# Patient Record
Sex: Female | Born: 2000 | Race: White | Hispanic: No | Smoking: Never smoker
Health system: Southern US, Community
[De-identification: ages and names within clinical notes are randomized; demographics above are authoritative.]

## PROBLEM LIST (undated history)

## (undated) DIAGNOSIS — F419 Anxiety disorder, unspecified: Secondary | ICD-10-CM

## (undated) DIAGNOSIS — H539 Unspecified visual disturbance: Secondary | ICD-10-CM

## (undated) DIAGNOSIS — B009 Herpesviral infection, unspecified: Secondary | ICD-10-CM

## (undated) DIAGNOSIS — J353 Hypertrophy of tonsils with hypertrophy of adenoids: Secondary | ICD-10-CM

## (undated) DIAGNOSIS — J342 Deviated nasal septum: Secondary | ICD-10-CM

## (undated) DIAGNOSIS — F32A Depression, unspecified: Secondary | ICD-10-CM

## (undated) DIAGNOSIS — J302 Other seasonal allergic rhinitis: Secondary | ICD-10-CM

## (undated) HISTORY — DX: Herpesviral infection, unspecified: B00.9

## (undated) HISTORY — DX: Anxiety disorder, unspecified: F41.9

## (undated) HISTORY — DX: Depression, unspecified: F32.A

## (undated) HISTORY — PX: ADENOIDECTOMY: SUR15

## (undated) HISTORY — PX: MYRINGOTOMY: SUR874

## (undated) HISTORY — DX: Deviated nasal septum: J34.2

---
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
## 2021-08-24 ENCOUNTER — Emergency Department (HOSPITAL_COMMUNITY)
Admission: EM | Admit: 2021-08-24 | Discharge: 2021-08-24 | Disposition: A | Discharge status: Home / Self Care | Payer: Medicaid Other | Attending: Emergency Medicine | Admitting: Emergency Medicine

## 2021-08-24 ENCOUNTER — Other Ambulatory Visit: Payer: Self-pay

## 2021-08-24 ENCOUNTER — Encounter (HOSPITAL_COMMUNITY): Payer: Self-pay | Admitting: Emergency Medicine

## 2021-08-24 DIAGNOSIS — J029 Acute pharyngitis, unspecified: Secondary | ICD-10-CM | POA: Diagnosis not present

## 2021-08-24 DIAGNOSIS — Z7722 Contact with and (suspected) exposure to environmental tobacco smoke (acute) (chronic): Secondary | ICD-10-CM | POA: Diagnosis not present

## 2021-08-24 LAB — GROUP A STREP BY PCR: Group A Strep by PCR: NOT DETECTED

## 2021-08-24 MED ORDER — LIDOCAINE VISCOUS HCL 2 % MT SOLN
15.0000 mL | Freq: Once | OROMUCOSAL | Status: AC
  Administered 2021-08-24: 15 mL via OROMUCOSAL
  Filled 2021-08-24: qty 15

## 2021-08-24 MED ORDER — IBUPROFEN 400 MG PO TABS
600.0000 mg | ORAL_TABLET | Freq: Once | ORAL | Status: AC
  Administered 2021-08-24: 600 mg via ORAL
  Filled 2021-08-24: qty 2

## 2021-08-24 NOTE — ED Provider Notes (Signed)
[facility] EMERGENCY DEPARTMENT Provider Note  CSN: [id number] Arrival date & time: 08/24/21 1206    History Chief Complaint  Patient presents with   Sore Throat    Melanie Rubio is a 20 y.o. female with history of strep, s/p tonsillectomy reports 2 days of sore throat worse with swallowing, not associated with fever or cough. No loss of taste or smell.    Past Medical History:  Diagnosis Date   Seasonal allergies    Tonsillar and adenoid hypertrophy    Vision abnormalities    wears glasses    Past Surgical History:  Procedure Laterality Date   ADENOIDECTOMY     CLOSED REDUCTION FOREARM FRACTURE Left 09/09/2010   MYRINGOTOMY     NASAL SEPTOPLASTY W/ TURBINOPLASTY Bilateral 12/04/2019   Procedure: NASAL SEPTOPLASTY WITH  BILATERAL TURBINATE REDUCTION;  Surgeon: Newman Pies, MD;  Location: [facility];  Service: ENT;  Laterality: Bilateral;   TONSILLECTOMY AND ADENOIDECTOMY N/A 06/13/2013   Procedure: TONSILLECTOMY AND ADENOIDECTOMY;  Surgeon: Darletta Moll, MD;  Location: [facility];  Service: ENT;  Laterality: N/A;    No family history on file.  Social History   Tobacco Use   Smoking status: Passive Smoke Exposure - Never Smoker   Smokeless tobacco: Never  Vaping Use   Vaping Use: Never used  Substance Use Topics   Alcohol use: No   Drug use: No     Home Medications Prior to Admission medications   Medication Sig Start Date End Date Taking? Authorizing Provider  cetirizine (ZYRTEC) 5 MG chewable tablet Chew 5 mg by mouth daily.    [provider]     Allergies    Patient has no known allergies.   Review of Systems   Review of Systems A comprehensive review of systems was completed and negative except as noted in HPI.    Physical Exam BP 129/77 (BP Location: Right Arm)   Pulse 77   Temp 98.6 F (37 C) (Oral)   Resp 15   SpO2 100%   Physical Exam Vitals and nursing note reviewed.  Constitutional:       Appearance: Normal appearance.  HENT:     Head: Normocephalic and atraumatic.     Nose: Nose normal.     Mouth/Throat:     Mouth: Mucous membranes are moist.     Pharynx: Uvula midline. Posterior oropharyngeal erythema present. No oropharyngeal exudate or uvula swelling.     Comments: Tonsils surgically absent Eyes:     Extraocular Movements: Extraocular movements intact.     Conjunctiva/sclera: Conjunctivae normal.  Cardiovascular:     Rate and Rhythm: Normal rate.  Pulmonary:     Effort: Pulmonary effort is normal.     Breath sounds: Normal breath sounds.  Abdominal:     General: Abdomen is flat.     Palpations: Abdomen is soft.     Tenderness: There is no abdominal tenderness.  Musculoskeletal:        General: No swelling. Normal range of motion.     Cervical back: Neck supple.  Lymphadenopathy:     Cervical: No cervical adenopathy.  Skin:    General: Skin is warm and dry.  Neurological:     General: No focal deficit present.     Mental Status: She is alert.  Psychiatric:        Mood and Affect: Mood normal.     ED Results / Procedures / Treatments   Labs (all labs ordered are  listed, but only abnormal results are displayed) Labs Reviewed  GROUP A STREP BY PCR    EKG None  Radiology No results found.  Procedures Procedures  Medications Ordered in the ED Medications  lidocaine (XYLOCAINE) 2 % viscous mouth solution 15 mL (has no administration in time range)  ibuprofen (ADVIL) tablet 600 mg (has no administration in time range)     MDM Rules/Calculators/A&P MDM Strep is negative. Likely viral, patient declined Covid testing. Recommend supportive care at home and PCP follow up. RTED for any other concerns.   ED Course  I have reviewed the triage vital signs and the nursing notes.  Pertinent labs & imaging results that were available during my care of the patient were reviewed by me and considered in my medical decision making (see chart for  details).     Final Clinical Impression(s) / ED Diagnoses Final diagnoses:  Viral pharyngitis    Rx / DC Orders ED Discharge Orders     None        Pollyann Savoy, MD 08/24/21 1351

## 2021-08-24 NOTE — ED Triage Notes (Signed)
Pt reports sore throat that started Friday afternoon. Denies fevers or cough.

## 2021-08-24 NOTE — ED Notes (Signed)
Pt ambulatory to waiting room. Pt verbalized understanding of discharge instructions.   

## 2022-01-30 ENCOUNTER — Emergency Department (HOSPITAL_COMMUNITY)
Admission: EM | Admit: 2022-01-30 | Discharge: 2022-01-30 | Disposition: A | Discharge status: Home / Self Care | Payer: Medicaid Other | Attending: Emergency Medicine | Admitting: Emergency Medicine

## 2022-01-30 ENCOUNTER — Encounter (HOSPITAL_COMMUNITY): Payer: Self-pay | Admitting: *Deleted

## 2022-01-30 DIAGNOSIS — N390 Urinary tract infection, site not specified: Secondary | ICD-10-CM | POA: Insufficient documentation

## 2022-01-30 DIAGNOSIS — R350 Frequency of micturition: Secondary | ICD-10-CM | POA: Diagnosis present

## 2022-01-30 LAB — URINALYSIS, ROUTINE W REFLEX MICROSCOPIC
Bilirubin Urine: NEGATIVE
Glucose, UA: NEGATIVE mg/dL
Hgb urine dipstick: NEGATIVE
Ketones, ur: 20 mg/dL — AB
Nitrite: NEGATIVE
Protein, ur: NEGATIVE mg/dL
Specific Gravity, Urine: 1.025 (ref 1.005–1.030)
pH: 6 (ref 5.0–8.0)

## 2022-01-30 LAB — POC URINE PREG, ED: Preg Test, Ur: NEGATIVE

## 2022-01-30 LAB — PREGNANCY, URINE: Preg Test, Ur: NEGATIVE

## 2022-01-30 MED ORDER — CEPHALEXIN 500 MG PO CAPS: 500.0000 mg | ORAL_CAPSULE | Freq: Four times a day (QID) | ORAL | 0 refills | Status: AC

## 2022-01-30 NOTE — Discharge Instructions (Addendum)
Follow up with your Physician for recheck  

## 2022-01-30 NOTE — ED Provider Notes (Signed)
? EMERGENCY DEPARTMENT ?Provider Note ? ? ?CSN: [id number] ?Arrival date & time: 01/30/22  1841 ? ?  ? ?History ? ?Chief Complaint  ?Patient presents with  ? Urinary Frequency  ? ? ?Melanie Rubio is a 21 y.o. female. ? ?The history is provided by the patient. No language interpreter was used.  ?Urinary Frequency ?This is a new problem. The current episode started yesterday. The problem occurs constantly. The problem has not changed since onset.Pertinent negatives include no abdominal pain. Nothing aggravates the symptoms. Nothing relieves the symptoms. She has tried nothing for the symptoms. The treatment provided no relief.  ? ?  ? ?Home Medications ?Prior to Admission medications   ?Medication Sig Start Date End Date Taking? Authorizing Provider  ?cephALEXin (KEFLEX) 500 MG capsule Take 1 capsule (500 mg total) by mouth 4 (four) times daily for 10 days. 01/30/22 02/09/22 Yes Elson Areas, PA-C  ?cetirizine (ZYRTEC) 5 MG chewable tablet Chew 5 mg by mouth daily.    [provider]  ?   ? ?Allergies    ?Patient has no known allergies.   ? ?Review of Systems   ?Review of Systems  ?Gastrointestinal:  Negative for abdominal pain.  ?Genitourinary:  Positive for frequency.  ?All other systems reviewed and are negative. ? ?Physical Exam ?Updated Vital Signs ?BP 126/71 (BP Location: Right Arm)   Pulse 69   Temp 98.7 ?F (37.1 ?C) (Oral)   Resp 18   Ht 5\' 5"  (1.651 m)   Wt 63.5 kg   SpO2 100%   BMI 23.30 kg/m?  ?Physical Exam ?Vitals and nursing note reviewed.  ?Constitutional:   ?   Appearance: She is well-developed.  ?HENT:  ?   Head: Normocephalic.  ?Cardiovascular:  ?   Rate and Rhythm: Normal rate.  ?Pulmonary:  ?   Effort: Pulmonary effort is normal.  ?Abdominal:  ?   General: There is no distension.  ?Musculoskeletal:     ?   General: Normal range of motion.  ?   Cervical back: Normal range of motion.  ?Skin: ?   General: Skin is warm.  ?Neurological:  ?   Mental Status: She is alert and  oriented to person, place, and time.  ? ? ?ED Results / Procedures / Treatments   ?Labs ?(all labs ordered are listed, but only abnormal results are displayed) ?Labs Reviewed  ?URINALYSIS, ROUTINE W REFLEX MICROSCOPIC - Abnormal; Notable for the following components:  ?    Result Value  ? APPearance CLOUDY (*)   ? Ketones, ur 20 (*)   ? Leukocytes,Ua SMALL (*)   ? Bacteria, UA MANY (*)   ? All other components within normal limits  ?PREGNANCY, URINE  ?POC URINE PREG, ED  ? ? ?EKG ?None ? ?Radiology ?No results found. ? ?Procedures ?Procedures  ? ? ?Medications Ordered in ED ?Medications - No data to display ? ?ED Course/ Medical Decision Making/ A&P ?  ?                        ?Medical Decision Making ?Amount and/or Complexity of Data Reviewed ?Labs: ordered. ? ?Risk ?Prescription drug management. ? ? ?MDM:  Ua shows many bacteria.  Pt given an rx for keflex.  Pt advised to follow up with primary care for recheck.  ? ? ? ? ? ? ? ?Final Clinical Impression(s) / ED Diagnoses ?Final diagnoses:  ?Urinary tract infection without hematuria, site unspecified  ? ? ?Rx / DC Orders ?  ED Discharge Orders   ? ?      Ordered  ?  cephALEXin (KEFLEX) 500 MG capsule  4 times daily       ? 01/30/22 2002  ? ?  ?  ? ?  ? ?An After Visit Summary was printed and given to the patient. ? ?  ?Elson Areas, [place] ?01/30/22 2023 ? ?  ?Linwood Dibbles, MD ?01/31/22 1618 ? ?

## 2022-01-30 NOTE — ED Triage Notes (Signed)
Urinary frequency for over a week ?

## 2022-03-08 ENCOUNTER — Encounter (HOSPITAL_COMMUNITY): Payer: Self-pay

## 2022-03-08 ENCOUNTER — Other Ambulatory Visit: Payer: Self-pay

## 2022-03-08 ENCOUNTER — Emergency Department (HOSPITAL_COMMUNITY)
Admission: EM | Admit: 2022-03-08 | Discharge: 2022-03-08 | Disposition: A | Discharge status: Home / Self Care | Payer: Medicaid Other | Attending: Emergency Medicine | Admitting: Emergency Medicine

## 2022-03-08 DIAGNOSIS — Z87440 Personal history of urinary (tract) infections: Secondary | ICD-10-CM | POA: Insufficient documentation

## 2022-03-08 DIAGNOSIS — R112 Nausea with vomiting, unspecified: Secondary | ICD-10-CM | POA: Diagnosis present

## 2022-03-08 DIAGNOSIS — R1084 Generalized abdominal pain: Secondary | ICD-10-CM | POA: Insufficient documentation

## 2022-03-08 LAB — URINALYSIS, ROUTINE W REFLEX MICROSCOPIC
Bilirubin Urine: NEGATIVE
Glucose, UA: NEGATIVE mg/dL
Hgb urine dipstick: NEGATIVE
Ketones, ur: 20 mg/dL — AB
Leukocytes,Ua: NEGATIVE
Nitrite: NEGATIVE
Protein, ur: NEGATIVE mg/dL
Specific Gravity, Urine: 1.015 (ref 1.005–1.030)
pH: 7 (ref 5.0–8.0)

## 2022-03-08 LAB — CBC WITH DIFFERENTIAL/PLATELET
Abs Immature Granulocytes: 0.01 10*3/uL (ref 0.00–0.07)
Basophils Absolute: 0 10*3/uL (ref 0.0–0.1)
Basophils Relative: 1 %
Eosinophils Absolute: 0.1 10*3/uL (ref 0.0–0.5)
Eosinophils Relative: 1 %
HCT: 44 % (ref 36.0–46.0)
Hemoglobin: 14.9 g/dL (ref 12.0–15.0)
Immature Granulocytes: 0 %
Lymphocytes Relative: 33 %
Lymphs Abs: 2.4 10*3/uL (ref 0.7–4.0)
MCH: 30.5 pg (ref 26.0–34.0)
MCHC: 33.9 g/dL (ref 30.0–36.0)
MCV: 90.2 fL (ref 80.0–100.0)
Monocytes Absolute: 0.5 10*3/uL (ref 0.1–1.0)
Monocytes Relative: 7 %
Neutro Abs: 4.2 10*3/uL (ref 1.7–7.7)
Neutrophils Relative %: 58 %
Platelets: 234 10*3/uL (ref 150–400)
RBC: 4.88 MIL/uL (ref 3.87–5.11)
RDW: 11.9 % (ref 11.5–15.5)
WBC: 7.2 10*3/uL (ref 4.0–10.5)
nRBC: 0 % (ref 0.0–0.2)

## 2022-03-08 LAB — POC URINE PREG, ED: Preg Test, Ur: NEGATIVE

## 2022-03-08 LAB — COMPREHENSIVE METABOLIC PANEL
ALT: 12 U/L (ref 0–44)
AST: 15 U/L (ref 15–41)
Albumin: 4.7 g/dL (ref 3.5–5.0)
Alkaline Phosphatase: 45 U/L (ref 38–126)
Anion gap: 9 (ref 5–15)
BUN: 9 mg/dL (ref 6–20)
CO2: 24 mmol/L (ref 22–32)
Calcium: 9.6 mg/dL (ref 8.9–10.3)
Chloride: 105 mmol/L (ref 98–111)
Creatinine, Ser: 0.67 mg/dL (ref 0.44–1.00)
GFR, Estimated: >60 mL/min (ref >60)
Glucose, Bld: 93 mg/dL (ref 70–99)
Potassium: 3.6 mmol/L (ref 3.5–5.1)
Sodium: 138 mmol/L (ref 135–145)
Total Bilirubin: 1.8 mg/dL — ABNORMAL HIGH (ref 0.3–1.2)
Total Protein: 8.1 g/dL (ref 6.5–8.1)

## 2022-03-08 LAB — LIPASE, BLOOD: Lipase: 29 U/L (ref 11–51)

## 2022-03-08 MED ORDER — SODIUM CHLORIDE 0.9 % IV BOLUS
1000.0000 mL | Freq: Once | INTRAVENOUS | Status: AC
  Administered 2022-03-08: 1000 mL via INTRAVENOUS

## 2022-03-08 NOTE — ED Triage Notes (Addendum)
Reports generalized fatigue for awhile but loss of appetite, nausea vomiting diarrhea 2 days ago.  Patient went to PCP on Tuesday and had negative preg test.  Hasnt vomited since Thursday, last meal was last night.  Just had nausea and dry heaving this am.  ?

## 2022-03-08 NOTE — Discharge Instructions (Signed)
Please follow-up with your primary care doctor for further evaluation.  Return to the emergency department for any worsening symptoms. ?

## 2022-03-08 NOTE — ED Provider Notes (Signed)
?[facility] ?Provider Note ? ? ?CSN: [id number] ?Arrival date & time: 03/08/22  1202 ? ?  ? ?History ?Chief Complaint  ?Patient presents with  ? Emesis  ? ? ?Melanie Rubio is a 21 y.o. female with no significant past medical history who presents to the emergency department today with multiple complaints.  She states on Tuesday she had 1 episode of vomiting and has had some intermittent diarrhea since then.  Over the last couple of weeks she has been having decreased appetite and has noticed a 5 to 6 pound weight loss over the last couple of months.  Patient also endorses intermittent abdominal pain that is poorly localized over the last week or so.  Significant other at bedside does state that patient has been battling some depression.  She does not take any medications for depression.  No history of anxiety.  She denies any fever or chills. She did have one episode of vomiting prior to arrival.  ? ?Of note, patient was seen and evaluated at her PCP on Tuesday and was given ciprofloxacin and metronidazole for UTI.  Patient did not complete those dosages as it made her nausea worse. ? ? ?Emesis ? ?  ? ?Home Medications ?Prior to Admission medications   ?Medication Sig Start Date End Date Taking? Authorizing Provider  ?metroNIDAZOLE (METROGEL) 0.75 % vaginal gel Place 1 Applicatorful vaginally at bedtime. 03/06/22  Yes [provider]  ?ondansetron (ZOFRAN-ODT) 4 MG disintegrating tablet Take 4 mg by mouth every 8 (eight) hours as needed. 03/06/22  Yes [provider]  ?cetirizine (ZYRTEC) 5 MG chewable tablet Chew 5 mg by mouth daily. ?Patient not taking: Reported on 03/08/2022    [provider]  ?ciprofloxacin (CIPRO) 250 MG tablet Take 250 mg by mouth 2 (two) times daily. ?Patient not taking: Reported on 03/08/2022 03/03/22   [provider]  ?metroNIDAZOLE (FLAGYL) 500 MG tablet Take 500 mg by mouth 2 (two) times daily. ?Patient not taking: Reported on  03/08/2022 03/03/22   [provider]  ?   ? ?Allergies    ?Patient has no known allergies.   ? ?Review of Systems   ?Review of Systems  ?Gastrointestinal:  Positive for vomiting.  ?All other systems reviewed and are negative. ? ?Physical Exam ?Updated Vital Signs ?BP 114/84   Pulse 65   Temp 98.2 ?F (36.8 ?C) (Oral)   Resp 18   Ht 5\' 5"  (1.651 m)   Wt 60.3 kg   LMP 03/07/2022   SpO2 100%   BMI 22.13 kg/m?  ?Physical Exam ?Vitals and nursing note reviewed.  ?Constitutional:   ?   General: She is not in acute distress. ?   Appearance: Normal appearance.  ?HENT:  ?   Head: Normocephalic and atraumatic.  ?Eyes:  ?   General:     ?   Right eye: No discharge.     ?   Left eye: No discharge.  ?Cardiovascular:  ?   Comments: Regular rate and rhythm.  S1/S2 are distinct without any evidence of murmur, rubs, or gallops.  Radial pulses are 2+ bilaterally.  Dorsalis pedis pulses are 2+ bilaterally.  No evidence of pedal edema. ?Pulmonary:  ?   Comments: Clear to auscultation bilaterally.  Normal effort.  No respiratory distress.  No evidence of wheezes, rales, or rhonchi heard throughout. ?Abdominal:  ?   General: Abdomen is flat. Bowel sounds are normal. There is no distension.  ?   Tenderness: There is no abdominal tenderness.  There is no guarding or rebound.  ?Musculoskeletal:     ?   General: Normal range of motion.  ?   Cervical back: Neck supple.  ?Skin: ?   General: Skin is warm and dry.  ?   Findings: No rash.  ?Neurological:  ?   General: No focal deficit present.  ?   Mental Status: She is alert.  ?Psychiatric:     ?   Mood and Affect: Mood normal.     ?   Behavior: Behavior normal.  ? ? ?ED Results / Procedures / Treatments   ?Labs ?(all labs ordered are listed, but only abnormal results are displayed) ?Labs Reviewed  ?COMPREHENSIVE METABOLIC PANEL - Abnormal; Notable for the following components:  ?    Result Value  ? Total Bilirubin 1.8 (*)   ? All other components within normal limits  ?URINALYSIS,  ROUTINE W REFLEX MICROSCOPIC - Abnormal; Notable for the following components:  ? APPearance CLOUDY (*)   ? Ketones, ur 20 (*)   ? All other components within normal limits  ?POC URINE PREG, ED - Normal  ?CBC WITH DIFFERENTIAL/PLATELET  ?LIPASE, BLOOD  ? ? ?EKG ?None ? ?Radiology ?No results found. ? ?Procedures ?Procedures  ? ? ?Medications Ordered in ED ?Medications  ?sodium chloride 0.9 % bolus 1,000 mL (0 mLs Intravenous Stopped 03/08/22 1544)  ? ? ?ED Course/ Medical Decision Making/ A&P ?  ?                        ?Medical Decision Making ?Amount and/or Complexity of Data Reviewed ?Labs: ordered. ? ? ?This patient presents to the ED for concern of vomiting and poorly localized abdominal pain, this involves an extensive number of treatment options, and is a complaint that carries with it a high risk of complications and morbidity.  The differential diagnosis includes viral gastroenteritis, somatic manifestation of depression, electrolyte abnormalities. ? ? ?Co morbidities that complicate the patient evaluation ? ?Past Medical History:  ?Diagnosis Date  ? Seasonal allergies   ? Tonsillar and adenoid hypertrophy   ? Vision abnormalities   ? wears glasses  ? ? ?Additional history obtained: ? ?Additional history obtained from nursing note ?Old records reviewed and no relevant documentation to her visit today. ? ?Lab Tests: ? ?I Ordered, and personally interpreted labs.  The pertinent results include: CBC shows no evidence of leukocytosis or anemia.  Lipase is normal.  Doubt pancreatitis.  Pregnancy negative.  CMP was normal.  Urinalysis normal. ? ? ?Imaging Studies ordered: ? ?None ? ? ?Cardiac Monitoring: ? ?The patient was maintained on a cardiac monitor.  I personally viewed and interpreted the cardiac monitored which showed an underlying rhythm of: Normal sinus rhythm ? ? ?Medicines ordered and prescription drug management: ? ?I ordered medication including fluids for dehydration ?Reevaluation of the patient  after these medicines showed that the patient improved ?I have reviewed the patients home medicines and have made adjustments as needed ? ? ?Test Considered: ? ?N/A ? ? ?Critical Interventions: ? ?Fluids ? ? ?Problem List / ED Course: ? ?Patient presents to the emergency department with multiple complaints.  Patient is complaining of vomiting, some episodes of diarrhea, and intermittent poorly localized abdominal pain.  Labs are all reassuring for any emergent pathology at this time.  Abdomen is soft and not tender.  I doubt acute abdomen at this time.  Patient feeling much better after fluids and tolerated p.o. intake in the ED.  Patient  comfortable going home.  She will call and schedule an appointment with her PCP next week.  She is safe for discharge.  Strict turn precautions were discussed. ? ? ?Reevaluation: ? ?After the interventions noted above, I reevaluated the patient and found that they have :improved ? ? ?Social Determinants of Health: ? ?Social Determinants of Health with Concerns  ? ?Tobacco Use: Medium Risk  ? Smoking Tobacco Use: Passive Smoke Exposure - Never Smoker  ? Smokeless Tobacco Use: Never  ? Passive Exposure: Yes  ?Financial Resource Strain: Not on file  ?Food Insecurity: Not on file  ?Transportation Needs: Not on file  ?Physical Activity: Not on file  ?Stress: Not on file  ?Social Connections: Not on file  ?Intimate Partner Violence: Not on file  ?Depression (PHQ2-9): Not on file  ?Alcohol Screen: Not on file  ?Housing: Not on file  ? ? ?Dispostion: ? ?After consideration of the diagnostic results and the patients response to treatment, I feel that the patent would benefit from outpatient follow-up with her PCP.  Vitals are completely normal.  She is safe for discharge. ? ?Final Clinical Impression(s) / ED Diagnoses ?Final diagnoses:  ?Nausea and vomiting, unspecified vomiting type  ? ? ?Rx / DC Orders ?ED Discharge Orders   ? ? None  ? ?  ? ? ?  ?Honor Loh [place], PA-C ?03/08/22  9485 ? ?  ?Rozelle Logan, DO ?03/12/22 1536 ? ?

## 2022-10-19 ENCOUNTER — Encounter (HOSPITAL_COMMUNITY): Payer: Self-pay | Admitting: Emergency Medicine

## 2022-10-19 ENCOUNTER — Other Ambulatory Visit: Payer: Self-pay

## 2022-10-19 ENCOUNTER — Emergency Department (HOSPITAL_COMMUNITY)
Admission: EM | Admit: 2022-10-19 | Discharge: 2022-10-19 | Discharge status: Against Medical Advice | Payer: Medicaid Other | Attending: Emergency Medicine | Admitting: Emergency Medicine

## 2022-10-19 DIAGNOSIS — Z5321 Procedure and treatment not carried out due to patient leaving prior to being seen by health care provider: Secondary | ICD-10-CM | POA: Diagnosis not present

## 2022-10-19 DIAGNOSIS — R300 Dysuria: Secondary | ICD-10-CM | POA: Diagnosis present

## 2022-10-19 LAB — URINALYSIS, ROUTINE W REFLEX MICROSCOPIC
Bilirubin Urine: NEGATIVE
Glucose, UA: NEGATIVE mg/dL
Ketones, ur: NEGATIVE mg/dL
Nitrite: NEGATIVE
Protein, ur: NEGATIVE mg/dL
Specific Gravity, Urine: 1.013 (ref 1.005–1.030)
pH: 6 (ref 5.0–8.0)

## 2022-10-19 LAB — PREGNANCY, URINE: Preg Test, Ur: NEGATIVE

## 2022-10-19 NOTE — ED Triage Notes (Signed)
Pt c/o dysuria, dark urine and vaginal irritation x one month.

## 2023-02-16 ENCOUNTER — Other Ambulatory Visit (HOSPITAL_COMMUNITY)
Admission: RE | Admit: 2023-02-16 | Discharge: 2023-02-16 | Disposition: A | Discharge status: Home / Self Care | Payer: Medicaid Other | Source: Ambulatory Visit | Attending: Adult Health | Admitting: Adult Health

## 2023-02-16 ENCOUNTER — Encounter: Payer: Self-pay | Admitting: Adult Health

## 2023-02-16 ENCOUNTER — Ambulatory Visit (INDEPENDENT_AMBULATORY_CARE_PROVIDER_SITE_OTHER): Payer: Medicaid Other | Admitting: Adult Health

## 2023-02-16 VITALS — BP 110/72 | HR 73 | Ht 65.0 in | Wt 156.0 lb

## 2023-02-16 DIAGNOSIS — Z0000 Encounter for general adult medical examination without abnormal findings: Secondary | ICD-10-CM

## 2023-02-16 DIAGNOSIS — R0981 Nasal congestion: Secondary | ICD-10-CM

## 2023-02-16 DIAGNOSIS — Z01419 Encounter for gynecological examination (general) (routine) without abnormal findings: Secondary | ICD-10-CM

## 2023-02-16 DIAGNOSIS — H6690 Otitis media, unspecified, unspecified ear: Secondary | ICD-10-CM | POA: Diagnosis not present

## 2023-02-16 MED ORDER — AZITHROMYCIN 250 MG PO TABS: ORAL_TABLET | ORAL | 0 refills | Status: DC

## 2023-02-16 NOTE — Progress Notes (Signed)
Patient ID: Melanie Rubio, female   DOB: January 18, 2001, 22 y.o.   MRN: [id number] History of Present Illness: Melanie Rubio is a 22 year old white female,single, G0P0, in for a well woman gyn exam and she wants her pap today. She is complaining of right ear stopped up and nasal congestion. She is a Camera operator.   PCP is [facility]   Current Medications, Allergies, Past Medical History, Past Surgical History, Family History and Social History were reviewed in [employer] record.     Review of Systems: Patient denies any headaches, hearing loss, fatigue, blurred vision, shortness of breath, chest pain, abdominal pain, problems with bowel movements, urination, or intercourse. No joint pain or mood swings.  See HPI for positives.   Physical Exam:BP 110/72 (BP Location: Left Arm, Patient Position: Sitting, Cuff Size: Normal)   Pulse 73   Ht 5\' 5"  (1.651 m)   Wt 156 lb (70.8 kg)   LMP 02/10/2023 (Approximate)   BMI 25.96 kg/m   General:  Well developed, well nourished, no acute distress Skin:  Warm and dry Neck:  Midline trachea, normal thyroid, good ROM, no lymphadenopathy, right ear is red has tubes, left ear +wax Lungs; Clear to auscultation bilaterally Breast:  No dominant palpable mass, retraction, or nipple discharge Cardiovascular: Regular rate and rhythm Abdomen:  Soft, non tender, no hepatosplenomegaly Pelvic:  External genitalia is normal in appearance, no lesions.  The vagina is normal in appearance,+brown blood. Urethra has no lesions or masses. The cervix is smooth and nulliparous, pap with GC/CHL performed.   Uterus is felt to be normal size, shape, and contour.  No adnexal masses or tenderness noted.Bladder is non tender, no masses felt. Extremities/musculoskeletal:  No swelling or varicosities noted, no clubbing or cyanosis Psych:  No mood changes, alert and cooperative,seems happy AA is 1 Fall risk is low    02/16/2023   10:57 AM  Depression screen PHQ 2/9   Decreased Interest 1  Down, Depressed, Hopeless 1  PHQ - 2 Score 2  Altered sleeping 2  Tired, decreased energy 3  Change in appetite 2  Feeling bad or failure about yourself  0  Trouble concentrating 0  Moving slowly or fidgety/restless 0  Suicidal thoughts 0  PHQ-9 Score 9   She is on lexapro     02/16/2023   10:57 AM  GAD 7 : Generalized Anxiety Score  Nervous, Anxious, on Edge 2  Control/stop worrying 2  Worry too much - different things 2  Trouble relaxing 0  Restless 0  Easily annoyed or irritable 2  Afraid - awful might happen 0  Total GAD 7 Score 8    Upstream - 02/16/23 1112       Pregnancy Intention Screening   Does the patient want to become pregnant in the next year? No    Does the patient's partner want to become pregnant in the next year? No    Would the patient like to discuss contraceptive options today? No      Contraception Wrap Up   Current Method Oral Contraceptive    End Method Oral Contraceptive              Examination chaperoned by Levy Pupa LPN  Impression and Plan: 1. Routine general medical examination at a health care facility Pap sent Pap in 3 years if normal Chlamydia on pap She gets lo Loestrin from PCP Follow up with PCP as scheduled   - Cytology - PAP( [place])  2.  Encounter for routine gynecological examination with Papanicolaou smear of cervix Pap sent  3. Chronic otitis media, unspecified otitis media type Right ear is red, "feels like needs to pop" Will rx azithromycin Meds ordered this encounter  Medications   azithromycin (ZITHROMAX) 250 MG tablet    Sig: Take 2 today, then 1 daily for 4 days    Dispense:  6 tablet    Refill:  0    Order Specific Question:   Supervising Provider    Answer:   Elonda Husky, LUTHER H [[id number]]     4. Nasal congestion Start zyrtec, or allegra or Claritin

## 2023-02-18 LAB — CYTOLOGY - PAP
Chlamydia: NEGATIVE
Comment: NEGATIVE
Comment: NORMAL
Neisseria Gonorrhea: NEGATIVE

## 2023-02-22 ENCOUNTER — Encounter: Payer: Self-pay | Admitting: Adult Health

## 2023-02-22 DIAGNOSIS — R87612 Low grade squamous intraepithelial lesion on cytologic smear of cervix (LGSIL): Secondary | ICD-10-CM | POA: Insufficient documentation

## 2024-02-17 ENCOUNTER — Ambulatory Visit: Payer: Medicaid Other | Admitting: Adult Health

## 2024-02-17 ENCOUNTER — Encounter: Payer: Self-pay | Admitting: Adult Health

## 2024-02-17 ENCOUNTER — Other Ambulatory Visit (HOSPITAL_COMMUNITY)
Admission: RE | Admit: 2024-02-17 | Discharge: 2024-02-17 | Disposition: A | Discharge status: Home / Self Care | Source: Ambulatory Visit | Attending: Adult Health | Admitting: Adult Health

## 2024-02-17 VITALS — BP 114/78 | HR 88 | Ht 66.0 in | Wt 175.0 lb

## 2024-02-17 DIAGNOSIS — Z01419 Encounter for gynecological examination (general) (routine) without abnormal findings: Secondary | ICD-10-CM | POA: Insufficient documentation

## 2024-02-17 DIAGNOSIS — R87612 Low grade squamous intraepithelial lesion on cytologic smear of cervix (LGSIL): Secondary | ICD-10-CM

## 2024-02-17 NOTE — Progress Notes (Signed)
 Patient ID: Melanie Rubio, female   DOB: 07-May-2001, 23 y.o.   MRN: [id number] History of Present Illness: Melanie Rubio is a 23 year old white female, with SO, G0P0, in for a well woman gyn exam and pap.    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    PCP is Coral Ceo NP at [facility].   Current Medications, Allergies, Past Medical History, Past Surgical History, Family History and Social History were reviewed in [employer] record.     Review of Systems: Patient denies any headaches, hearing loss, fatigue, blurred vision, shortness of breath, chest pain, abdominal pain, problems with bowel movements, urination, or intercourse. No joint pain or mood swings.  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    Physical Exam:BP 114/78 (BP Location: Left Arm, Patient Position: Sitting, Cuff Size: Normal)   Pulse 88   Ht 5\' 6"  (1.676 m)   Wt 175 lb (79.4 kg)   LMP 02/07/2024 (Exact Date)   BMI 28.25 kg/m   General:  Well developed, well nourished, no acute distress Skin:  Warm and dry Neck:  Midline trachea, normal thyroid, good ROM, no lymphadenopathy Lungs; Clear to auscultation bilaterally Breast:  No dominant palpable mass, retraction, or nipple discharge Cardiovascular: Regular rate and rhythm Abdomen:  Soft, non tender, no hepatosplenomegaly Pelvic:  External genitalia is normal in appearance, no lesions.  The vagina is normal in appearance. Urethra has no lesions or masses. The cervix is smooth, pap with GC/CHL performed.   Uterus is felt to be normal size, shape, and contour.  No adnexal masses or tenderness noted.Bladder is non tender, no masses felt. Extremities/musculoskeletal:  No swelling or varicosities noted, no clubbing or cyanosis Psych:  No mood changes, alert and cooperative,seems happy AA is 4 Fall risk is low     02/17/2024   10:43 AM 02/16/2023   10:57 AM  Depression screen PHQ 2/9  Decreased Interest 0 1  Down, Depressed, Hopeless 0 1  PHQ - 2 Score 0 2  Altered sleeping 1 2  Tired, decreased energy 1 3  Change in appetite 0 2  Feeling bad or failure about yourself  0 0  Trouble concentrating 0 0  Moving slowly or fidgety/restless 0 0  Suicidal thoughts 0 0  PHQ-9 Score 2 9       02/17/2024   10:44 AM 02/16/2023   10:57 AM  GAD 7 : Generalized Anxiety Score  Nervous, Anxious, on Edge 1 2  Control/stop worrying 1 2  Worry too much - different things 1 2  Trouble relaxing 0 0  Restless 0 0  Easily annoyed or irritable 1 2  Afraid - awful might happen 0 0  Total GAD 7 Score 4 8      Upstream - 02/17/24 1043       Pregnancy Intention Screening   Does the patient want to become pregnant in the next year? Unsure    Does the patient's partner want to become pregnant in the next year? Unsure    Would the patient like to discuss contraceptive options today? No      Contraception Wrap Up   Current Method Oral Contraceptive    End Method Oral Contraceptive    Contraception Counseling Provided Yes            Examination chaperoned by Malachy Mood LPN  Impression and plan: 1. Encounter for gynecological examination with Papanicolaou smear of cervix (Primary) Pap sent Nex pap TBD when results back  Physical in 1 year - Cytology - PAP( [place])  2. LGSIL on Pap smear of cervix Pap sent

## 2024-02-21 LAB — CYTOLOGY - PAP
Chlamydia: NEGATIVE
Comment: NEGATIVE
Comment: NORMAL
Neisseria Gonorrhea: NEGATIVE
